# Patient Record
Sex: Male | Born: 1960 | Race: White | Hispanic: No | Marital: Single | Smoking: Current every day smoker
Health system: Southern US, Community
[De-identification: ages and names within clinical notes are randomized; demographics above are authoritative.]

## PROBLEM LIST (undated history)

## (undated) DIAGNOSIS — F319 Bipolar disorder, unspecified: Secondary | ICD-10-CM

## (undated) DIAGNOSIS — J45909 Unspecified asthma, uncomplicated: Secondary | ICD-10-CM

## (undated) DIAGNOSIS — E785 Hyperlipidemia, unspecified: Secondary | ICD-10-CM

## (undated) DIAGNOSIS — I509 Heart failure, unspecified: Secondary | ICD-10-CM

## (undated) DIAGNOSIS — M1990 Unspecified osteoarthritis, unspecified site: Secondary | ICD-10-CM

## (undated) HISTORY — DX: Heart failure, unspecified: I50.9

## (undated) HISTORY — DX: Unspecified osteoarthritis, unspecified site: M19.90

## (undated) HISTORY — DX: Bipolar disorder, unspecified: F31.9

## (undated) HISTORY — PX: CHOLECYSTECTOMY: SHX55

## (undated) HISTORY — DX: Unspecified asthma, uncomplicated: J45.909

## (undated) HISTORY — DX: Hyperlipidemia, unspecified: E78.5

---

## 2013-01-25 DIAGNOSIS — G40909 Epilepsy, unspecified, not intractable, without status epilepticus: Secondary | ICD-10-CM

## 2013-01-25 HISTORY — DX: Epilepsy, unspecified, not intractable, without status epilepticus: G40.909

## 2013-01-30 DIAGNOSIS — F411 Generalized anxiety disorder: Secondary | ICD-10-CM

## 2013-01-30 HISTORY — DX: Generalized anxiety disorder: F41.1

## 2013-10-11 DIAGNOSIS — E119 Type 2 diabetes mellitus without complications: Secondary | ICD-10-CM

## 2013-10-11 HISTORY — DX: Type 2 diabetes mellitus without complications: E11.9

## 2014-10-04 DIAGNOSIS — I509 Heart failure, unspecified: Secondary | ICD-10-CM

## 2014-10-04 DIAGNOSIS — I2510 Atherosclerotic heart disease of native coronary artery without angina pectoris: Secondary | ICD-10-CM | POA: Insufficient documentation

## 2014-10-04 HISTORY — DX: Heart failure, unspecified: I50.9

## 2014-10-04 HISTORY — DX: Atherosclerotic heart disease of native coronary artery without angina pectoris: I25.10

## 2017-05-12 DIAGNOSIS — G894 Chronic pain syndrome: Secondary | ICD-10-CM

## 2017-05-12 DIAGNOSIS — E1142 Type 2 diabetes mellitus with diabetic polyneuropathy: Secondary | ICD-10-CM

## 2017-05-12 DIAGNOSIS — Z720 Tobacco use: Secondary | ICD-10-CM

## 2017-05-12 DIAGNOSIS — M7918 Myalgia, other site: Secondary | ICD-10-CM

## 2017-05-12 DIAGNOSIS — M19019 Primary osteoarthritis, unspecified shoulder: Secondary | ICD-10-CM

## 2017-05-12 HISTORY — DX: Primary osteoarthritis, unspecified shoulder: M19.019

## 2017-05-12 HISTORY — DX: Tobacco use: Z72.0

## 2017-05-12 HISTORY — DX: Myalgia, other site: M79.18

## 2017-05-12 HISTORY — DX: Chronic pain syndrome: G89.4

## 2017-05-12 HISTORY — DX: Type 2 diabetes mellitus with diabetic polyneuropathy: E11.42

## 2017-07-09 DIAGNOSIS — I10 Essential (primary) hypertension: Secondary | ICD-10-CM

## 2017-07-09 DIAGNOSIS — J449 Chronic obstructive pulmonary disease, unspecified: Secondary | ICD-10-CM

## 2017-07-09 HISTORY — DX: Chronic obstructive pulmonary disease, unspecified: J44.9

## 2017-07-09 HISTORY — DX: Essential (primary) hypertension: I10

## 2019-01-01 DIAGNOSIS — E6609 Other obesity due to excess calories: Secondary | ICD-10-CM

## 2019-01-01 DIAGNOSIS — Z6831 Body mass index (BMI) 31.0-31.9, adult: Secondary | ICD-10-CM

## 2019-01-01 HISTORY — DX: Other obesity due to excess calories: E66.09

## 2019-01-01 HISTORY — DX: Body mass index (BMI) 31.0-31.9, adult: Z68.31

## 2019-06-30 ENCOUNTER — Ambulatory Visit: Payer: Medicaid Other | Admitting: Cardiology

## 2019-07-02 ENCOUNTER — Encounter: Payer: Self-pay | Admitting: *Deleted

## 2019-07-02 ENCOUNTER — Ambulatory Visit: Payer: Medicaid Other | Admitting: Cardiology

## 2019-08-26 ENCOUNTER — Other Ambulatory Visit: Payer: Self-pay | Admitting: Gastroenterology

## 2019-08-28 ENCOUNTER — Other Ambulatory Visit (HOSPITAL_COMMUNITY)
Admission: RE | Admit: 2019-08-28 | Discharge: 2019-08-28 | Disposition: A | Discharge status: Home / Self Care | Payer: Medicaid Other | Source: Ambulatory Visit | Attending: Gastroenterology | Admitting: Gastroenterology

## 2019-08-28 DIAGNOSIS — Z20822 Contact with and (suspected) exposure to covid-19: Secondary | ICD-10-CM | POA: Diagnosis not present

## 2019-08-28 DIAGNOSIS — Z01812 Encounter for preprocedural laboratory examination: Secondary | ICD-10-CM | POA: Diagnosis present

## 2019-08-30 ENCOUNTER — Encounter (HOSPITAL_COMMUNITY): Payer: Self-pay | Admitting: Gastroenterology

## 2019-08-30 ENCOUNTER — Other Ambulatory Visit: Payer: Self-pay

## 2019-08-30 LAB — NOVEL CORONAVIRUS, NAA (HOSP ORDER, SEND-OUT TO REF LAB; TAT 18-24 HRS): SARS-CoV-2, NAA: NOT DETECTED

## 2019-09-01 ENCOUNTER — Ambulatory Visit (HOSPITAL_COMMUNITY)
Admission: RE | Admit: 2019-09-01 | Discharge: 2019-09-01 | Disposition: A | Discharge status: Home / Self Care | Payer: Medicaid Other | Attending: Gastroenterology | Admitting: Gastroenterology

## 2019-09-01 ENCOUNTER — Encounter (HOSPITAL_COMMUNITY)
Admission: RE | Disposition: A | Discharge status: Home / Self Care | Payer: Self-pay | Source: Home / Self Care | Attending: Gastroenterology

## 2019-09-01 ENCOUNTER — Ambulatory Visit (HOSPITAL_COMMUNITY): Payer: Medicaid Other | Admitting: Certified Registered"

## 2019-09-01 ENCOUNTER — Encounter (HOSPITAL_COMMUNITY): Payer: Self-pay | Admitting: Gastroenterology

## 2019-09-01 ENCOUNTER — Other Ambulatory Visit: Payer: Self-pay

## 2019-09-01 DIAGNOSIS — Z885 Allergy status to narcotic agent status: Secondary | ICD-10-CM | POA: Diagnosis not present

## 2019-09-01 DIAGNOSIS — M1990 Unspecified osteoarthritis, unspecified site: Secondary | ICD-10-CM | POA: Insufficient documentation

## 2019-09-01 DIAGNOSIS — F17200 Nicotine dependence, unspecified, uncomplicated: Secondary | ICD-10-CM | POA: Diagnosis not present

## 2019-09-01 DIAGNOSIS — Z8249 Family history of ischemic heart disease and other diseases of the circulatory system: Secondary | ICD-10-CM | POA: Diagnosis not present

## 2019-09-01 DIAGNOSIS — J449 Chronic obstructive pulmonary disease, unspecified: Secondary | ICD-10-CM | POA: Insufficient documentation

## 2019-09-01 DIAGNOSIS — Z6835 Body mass index (BMI) 35.0-35.9, adult: Secondary | ICD-10-CM | POA: Insufficient documentation

## 2019-09-01 DIAGNOSIS — I2510 Atherosclerotic heart disease of native coronary artery without angina pectoris: Secondary | ICD-10-CM | POA: Diagnosis not present

## 2019-09-01 DIAGNOSIS — E1142 Type 2 diabetes mellitus with diabetic polyneuropathy: Secondary | ICD-10-CM | POA: Diagnosis not present

## 2019-09-01 DIAGNOSIS — G894 Chronic pain syndrome: Secondary | ICD-10-CM | POA: Diagnosis not present

## 2019-09-01 DIAGNOSIS — F319 Bipolar disorder, unspecified: Secondary | ICD-10-CM | POA: Insufficient documentation

## 2019-09-01 DIAGNOSIS — I110 Hypertensive heart disease with heart failure: Secondary | ICD-10-CM | POA: Insufficient documentation

## 2019-09-01 DIAGNOSIS — Z79899 Other long term (current) drug therapy: Secondary | ICD-10-CM | POA: Insufficient documentation

## 2019-09-01 DIAGNOSIS — G40909 Epilepsy, unspecified, not intractable, without status epilepticus: Secondary | ICD-10-CM | POA: Diagnosis not present

## 2019-09-01 DIAGNOSIS — C252 Malignant neoplasm of tail of pancreas: Secondary | ICD-10-CM | POA: Diagnosis not present

## 2019-09-01 DIAGNOSIS — Z794 Long term (current) use of insulin: Secondary | ICD-10-CM | POA: Insufficient documentation

## 2019-09-01 DIAGNOSIS — Z833 Family history of diabetes mellitus: Secondary | ICD-10-CM | POA: Insufficient documentation

## 2019-09-01 DIAGNOSIS — E669 Obesity, unspecified: Secondary | ICD-10-CM | POA: Diagnosis not present

## 2019-09-01 DIAGNOSIS — K869 Disease of pancreas, unspecified: Secondary | ICD-10-CM | POA: Diagnosis present

## 2019-09-01 DIAGNOSIS — Z888 Allergy status to other drugs, medicaments and biological substances status: Secondary | ICD-10-CM | POA: Diagnosis not present

## 2019-09-01 DIAGNOSIS — I509 Heart failure, unspecified: Secondary | ICD-10-CM | POA: Insufficient documentation

## 2019-09-01 HISTORY — PX: ESOPHAGOGASTRODUODENOSCOPY (EGD) WITH PROPOFOL: SHX5813

## 2019-09-01 HISTORY — PX: UPPER ESOPHAGEAL ENDOSCOPIC ULTRASOUND (EUS): SHX6562

## 2019-09-01 HISTORY — PX: FINE NEEDLE ASPIRATION: SHX5430

## 2019-09-01 LAB — POCT I-STAT, CHEM 8
BUN: 15 mg/dL (ref 6–20)
Calcium, Ion: 1.07 mmol/L — ABNORMAL LOW (ref 1.15–1.40)
Chloride: 101 mmol/L (ref 98–111)
Creatinine, Ser: 0.7 mg/dL (ref 0.61–1.24)
Glucose, Bld: 279 mg/dL — ABNORMAL HIGH (ref 70–99)
HCT: 46 % (ref 39.0–52.0)
Hemoglobin: 15.6 g/dL (ref 13.0–17.0)
Potassium: 4.8 mmol/L (ref 3.5–5.1)
Sodium: 135 mmol/L (ref 135–145)
TCO2: 26 mmol/L (ref 22–32)

## 2019-09-01 LAB — GLUCOSE, CAPILLARY
Glucose-Capillary: 266 mg/dL — ABNORMAL HIGH (ref 70–99)
Glucose-Capillary: 235 mg/dL — ABNORMAL HIGH (ref 70–99)

## 2019-09-01 SURGERY — UPPER ESOPHAGEAL ENDOSCOPIC ULTRASOUND (EUS)
Anesthesia: Monitor Anesthesia Care

## 2019-09-01 MED ORDER — INSULIN REGULAR HUMAN 100 UNIT/ML IJ SOLN
INTRAMUSCULAR | Status: DC | PRN
  Administered 2019-09-01: 11:00:00 5 [IU] via SUBCUTANEOUS

## 2019-09-01 MED ORDER — PROPOFOL 10 MG/ML IV BOLUS
INTRAVENOUS | Status: DC | PRN
  Administered 2019-09-01: 150 ug/kg/min via INTRAVENOUS

## 2019-09-01 MED ORDER — LACTATED RINGERS IV SOLN: INTRAVENOUS | Status: DC | PRN

## 2019-09-01 MED ORDER — HYDROMORPHONE HCL 4 MG PO TABS: 4.0000 mg | ORAL_TABLET | ORAL | 0 refills | Status: AC | PRN

## 2019-09-01 MED ORDER — INSULIN ASPART 100 UNIT/ML ~~LOC~~ SOLN
SUBCUTANEOUS | Status: AC
  Filled 2019-09-01: qty 1

## 2019-09-01 NOTE — Discharge Instructions (Signed)
Endoscopic Ultrasound ° °Care After °Please read the instructions outlined below and refer to this sheet in the next few weeks. These discharge instructions provide you with general information on caring for yourself after you leave the hospital. Your doctor may also give you specific instructions. While your treatment has been planned according to the most current medical practices available, unavoidable complications occasionally occur. If you have any problems or questions after discharge, please call Dr. Fran Neiswonger ([facility]) at [phone]. ° °HOME CARE INSTRUCTIONS °Activity °· You may resume your regular activity but move at a slower pace for the next 24 hours.  °· Take frequent rest periods for the next 24 hours.  °· Walking will help expel (get rid of) the air and reduce the bloated feeling in your abdomen.  °· No driving for 24 hours (because of the anesthesia (medicine) used during the test).  °· You may shower.  °· Do not sign any important legal documents or operate any machinery for 24 hours (because of the anesthesia used during the test).  °Nutrition °· Drink plenty of fluids.  °· You may resume your normal diet.  °· Begin with a light meal and progress to your normal diet.  °· Avoid alcoholic beverages for 24 hours or as instructed by your caregiver.  °Medications °You may resume your normal medications unless your caregiver tells you otherwise. °What you can expect today °· You may experience abdominal discomfort such as a feeling of fullness or "gas" pains.  °· You may experience a sore throat for 2 to 3 days. This is normal. Gargling with salt water may help this.  °·  °SEEK IMMEDIATE MEDICAL CARE IF: °· You have excessive nausea (feeling sick to your stomach) and/or vomiting.  °· You have severe abdominal pain and distention (swelling).  °· You have trouble swallowing.  °· You have a temperature over 100° F (37.8° C).  °· You have rectal bleeding or vomiting of blood.  °Document  Released: 03/26/2004 Document Revised: 04/24/2011 Document Reviewed: 10/07/2007 °ExitCare® Patient Information ©2012 [employer]. °

## 2019-09-01 NOTE — Anesthesia Postprocedure Evaluation (Signed)
Anesthesia Post Note  Patient: Daniel Pittman  Procedure(s) Performed: UPPER ESOPHAGEAL ENDOSCOPIC ULTRASOUND (EUS) with FNA (N/A ) ESOPHAGOGASTRODUODENOSCOPY (EGD) WITH PROPOFOL (N/A )     Patient location during evaluation: Endoscopy Anesthesia Type: MAC Level of consciousness: awake and alert Pain management: pain level controlled Vital Signs Assessment: post-procedure vital signs reviewed and stable Respiratory status: spontaneous breathing, nonlabored ventilation, respiratory function stable and patient connected to nasal cannula oxygen Cardiovascular status: stable and blood pressure returned to baseline Postop Assessment: no apparent nausea or vomiting Anesthetic complications: no    Last Vitals:  Vitals:   09/01/19 1240 09/01/19 1250  BP: (!) 133/101 (!) 145/89  Pulse: 85 80  Resp: 18 18  Temp:    SpO2: 98% 97%    Last Pain:  Vitals:   09/01/19 1250  TempSrc:   PainSc: 10-Worst pain ever                 Ryzen Deady P Zemirah Krasinski

## 2019-09-01 NOTE — H&P (Signed)
[facility] Admission Note  Chief Complaint: pancreatic mass  HPI: Daniel Pittman is an 59 y.o. male.  With 3 month history upper abdominal pain, radiating to back, worse over the past 3 months.  Chronic pain in low back and hips, distinctly different than current symptoms.  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  Past Medical History:  Diagnosis Date  . Arthritis   . 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 Asthma   . CHF (congestive heart failure) ([place])   . Chronic coronary artery disease 10/04/2014  . Chronic pain syndrome 05/12/2017  . Class 1 obesity due to excess calories with serious comorbidity and body mass index (BMI) of 31.0 to 31.9 in adult 01/01/2019  . COPD (chronic obstructive pulmonary disease) ([place]) 07/09/2017  . Diabetes mellitus type 2, uncomplicated ([place]) 8/50/2774  . Diabetic polyneuropathy associated with type 2 diabetes mellitus ([place]) 05/12/2017  . Generalized anxiety disorder 01/30/2013  . Heart failure, unspecified ([place]) 10/04/2014  . Hyperlipemia   . Hypertension 07/09/2017  . Manic-depression ([place])   . Myofascial pain syndrome 05/12/2017  . Seizure disorder ([place]) 01/25/2013  . Tobacco abuse 05/12/2017    Past Surgical History:  Procedure Laterality Date  . CHOLECYSTECTOMY      Medications Prior to Admission  Medication Sig Dispense Refill  . carvedilol (COREG) 6.25 MG tablet Take 6.25 mg by mouth 2 (two) times daily.    . furosemide (LASIX) 20 MG tablet Take 20 mg by mouth 2 (two) times daily.    Marland Kitchen glipiZIDE (GLUCOTROL) 5 MG tablet Take 5 mg by mouth 2 (two) times daily.    Marland Kitchen HUMALOG KWIKPEN 100 UNIT/ML KwikPen Inject 3-11 Units into the skin 3 (three) times daily with meals.    Marland Kitchen LANTUS SOLOSTAR 100 UNIT/ML Solostar Pen Inject 25 Units into the skin at bedtime.    Marland Kitchen lisinopril-hydrochlorothiazide (ZESTORETIC) 20-25 MG tablet Take 1 tablet by mouth daily.    Marland Kitchen omeprazole (PRILOSEC) 40 MG capsule Take 40 mg by  mouth daily as needed.    Marland Kitchen oxyCODONE-acetaminophen (PERCOCET/ROXICET) 5-325 MG tablet Take 1 tablet by mouth every 6 (six) hours as needed for pain.    . Vitamin D, Ergocalciferol, (DRISDOL) 1.25 MG (50000 UT) CAPS capsule Take 50,000 Units by mouth every Wednesday.    Marland Kitchen NARCAN 4 MG/0.1ML LIQD nasal spray kit Place 1 spray into the nose as needed. Use every 2-3 minutes until responsive or EMS arrives      Allergies:  Allergies  Allergen Reactions  . Hydrocodone-Acetaminophen Itching  . Tramadol Nausea And Vomiting    Family History  Problem Relation Age of Onset  . Cancer Mother   . Diabetes Mother   . Hypertension Mother   . Cancer Father   . Stroke Sister   . Seizures Sister   . Hypertension Sister   . Diabetes Maternal Grandmother     Social History:  reports that he has been smoking. He has never used smokeless tobacco. He reports previous alcohol use. No history on file for drug.   ROS: As per HPI, all others negative   Blood pressure (!) 174/112, pulse 83, temperature 98.9 F (37.2 C), temperature source Oral, resp. rate 19, height '5\' 7"'  (1.702 m), weight 102.1 kg, SpO2 98 %. General appearance: overweight, NAD Head: NCAT Eyes: Anicteric Neck: Supple GI: Upper abdominal tenderness without peritonitis Extremities: No edema Skin: No rash Neurologic: Non focal without lateralizing signs  Results for orders placed or performed  during the hospital encounter of 09/01/19 (from the past 48 hour(s))  Glucose, capillary     Status: Abnormal   Collection Time: 09/01/19 10:42 AM  Result Value Ref Range   Glucose-Capillary 266 (H) 70 - 99 mg/dL  I-STAT, chem 8     Status: Abnormal   Collection Time: 09/01/19 10:55 AM  Result Value Ref Range   Sodium 135 135 - 145 mmol/L   Potassium 4.8 3.5 - 5.1 mmol/L   Chloride 101 98 - 111 mmol/L   BUN 15 6 - 20 mg/dL   Creatinine, Ser 0.70 0.61 - 1.24 mg/dL   Glucose, Bld 279 (H) 70 - 99 mg/dL   Calcium, Ion 1.07 (L) 1.15 - 1.40  mmol/L   TCO2 26 22 - 32 mmol/L   Hemoglobin 15.6 13.0 - 17.0 g/dL   HCT 46.0 39.0 - 52.0 %   No results found.  Assessment/Plan  1.  Abdominal pain, likely from pancreatic tail mass. 2.  Plan for endoscopic ultrasound with fine needle aspiration. 3.  Next step in management pending EUS findings. 4.  Risks (bleeding, infection, bowel perforation that could require surgery, sedation-related changes in cardiopulmonary systems), benefits (identification and possible treatment of source of symptoms, exclusion of certain causes of symptoms), and alternatives (watchful waiting, radiographic imaging studies, empiric medical treatment) of upper endoscopy with ultrasound and likely fine needle aspiration (EUS +/- FNA) were explained to patient/family in detail and patient wishes to proceed.  [name] 09/01/2019, 11:26 AM

## 2019-09-01 NOTE — Anesthesia Preprocedure Evaluation (Addendum)
Anesthesia Evaluation  Patient identified by MRN, date of birth, ID band Patient awake    Reviewed: Allergy & Precautions, NPO status , Patient's Chart, lab work & pertinent test results, reviewed documented beta blocker date and time   Airway Mallampati: II  TM Distance: >3 FB Neck ROM: Full    Dental  (+) Edentulous Upper, Edentulous Lower   Pulmonary asthma , COPD,  COPD inhaler, Current SmokerPatient did not abstain from smoking.,    Pulmonary exam normal breath sounds clear to auscultation       Cardiovascular hypertension, Pt. on home beta blockers and Pt. on medications + CAD and +CHF  Normal cardiovascular exam Rhythm:Regular Rate:Normal     Neuro/Psych PSYCHIATRIC DISORDERS Anxiety Bipolar Disorder    GI/Hepatic negative GI ROS, (+)     substance abuse  ,   Endo/Other  diabetes, Oral Hypoglycemic Agents, Insulin Dependent  Renal/GU negative Renal ROS     Musculoskeletal  (+) narcotic dependentMyofascial pain syndrome Chronic pain syndrome   Abdominal (+) + obese,   Peds  Hematology HLD   Anesthesia Other Findings pancreatic mass  Reproductive/Obstetrics                            Anesthesia Physical Anesthesia Plan  ASA: III  Anesthesia Plan: MAC   Post-op Pain Management:    Induction: Intravenous  PONV Risk Score and Plan: 0 and Propofol infusion and Treatment may vary due to age or medical condition  Airway Management Planned: Nasal Cannula  Additional Equipment:   Intra-op Plan:   Post-operative Plan:   Informed Consent: I have reviewed the patients History and Physical, chart, labs and discussed the procedure including the risks, benefits and alternatives for the proposed anesthesia with the patient or authorized representative who has indicated his/her understanding and acceptance.       Plan Discussed with: CRNA  Anesthesia Plan Comments:         Anesthesia Quick Evaluation

## 2019-09-01 NOTE — Op Note (Signed)
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 Estimated Blood Loss:     Estimated blood loss: none. Procedure:                Pre-Anesthesia Assessment:                           - Prior to the procedure, a History and Physical                            was performed, and patient medications and                            allergies were reviewed. The patient's tolerance of                            previous anesthesia was also reviewed. The risks                            and benefits of the procedure and the sedation                            options and risks were discussed with the patient.                            All questions were answered, and informed consent                            was obtained. Prior Anticoagulants: The patient has                            taken no previous anticoagulant or antiplatelet                            agents. ASA Grade Assessment: III - A patient with                            severe systemic disease. After reviewing the risks  and benefits, the patient was deemed in                            satisfactory condition to undergo the procedure.                           After obtaining informed consent, the endoscope was                            passed under direct vision. Throughout the                            procedure, the patient's blood pressure, pulse, and     oxygen saturations were monitored continuously. The                            [id number]) Olympus Linear EUS was                            introduced through the mouth, and advanced to the                            second part of duodenum. The upper EUS was                            accomplished without difficulty. The patient                            tolerated the procedure well. Scope In: Scope Out: Findings:      ENDOSONOGRAPHIC FINDING: :      There was no sign of significant endosonographic abnormality in the left       lobe of the liver.      One lymph node was visualized with the ultrasound probe in the celiac       region (level 20). The node was round. It measured 5 mm by 5 mm.      An irregular mass was identified in the pancreatic tail. The mass was       hypoechoic. The mass measured 25 mm by 30 mm in maximal cross-sectional       diameter. The endosonographic borders were poorly-defined. There was       sonographic evidence suggesting invasion into the splenic artery       (manifested by invasion) and the splenic vein (manifested by invasion).       An intact interface was seen between the mass and the superior       mesenteric artery and celiac trunk suggesting a lack of invasion. The       remainder of the pancreas was examined. The endosonographic appearance       of parenchyma and the upstream pancreatic duct indicated duct dilation.       Fine needle aspiration for cytology was performed. Color Doppler imaging       was utilized prior to needle puncture to confirm a lack of significant       vascular structures within the needle path. Three passes were made with       the 25 gauge needle using a transgastric  approach. A stylet was used. A       cytologist was present and performed a preliminary cytologic       examination. Final cytology results are pending. Impression:               - There was no evidence of significant pathology in                             the left lobe of the liver.                           - One lymph node was visualized and measured in the                            celiac region (level 20).                           - A mass was identified in the pancreatic tail.                            This was staged T3 N1 Mx by endosonographic                            criteria. Fine needle aspiration performed. Moderate Sedation:      None Recommendation:           - Discharge patient to home (via wheelchair).                           - Resume previous diet today.                           - Continue present medications.                           - Await cytology results.                           - Return to GI office PRN.                           - Refer to a surgeon at appointment to be scheduled.                           - Refer to an oncologist at appointment to be                            scheduled. Procedure Code(s):        --- Professional ---                           [phone], Esophagogastroduodenoscopy, flexible,                            transoral; with transendoscopic ultrasound-guided  intramural or transmural fine needle                            aspiration/biopsy(s), (includes endoscopic                            ultrasound examination limited to the esophagus,                            stomach or duodenum, and adjacent structures) Diagnosis Code(s):        --- Professional ---                           K86.89, Other specified diseases of pancreas CPT copyright 2019 American Medical Association. All rights reserved. The codes documented in this report are preliminary and upon coder review may  be revised to meet current compliance requirements. Arta Silence, MD 09/01/2019 12:26:20 PM This report has been signed electronically. Number of Addenda: 0

## 2019-09-01 NOTE — Anesthesia Procedure Notes (Signed)
Procedure Name: MAC Date/Time: 09/01/2019 11:55 AM Performed by: Cynda Familia, CRNA Pre-anesthesia Checklist: Patient identified, Suction available, Patient being monitored and Emergency Drugs available Patient Re-evaluated:Patient Re-evaluated prior to induction Oxygen Delivery Method: Simple face mask Placement Confirmation: positive ETCO2 and breath sounds checked- equal and bilateral Dental Injury: Teeth and Oropharynx as per pre-operative assessment

## 2019-09-01 NOTE — Transfer of Care (Signed)
Immediate Anesthesia Transfer of Care Note  Patient: Daniel Pittman  Procedure(s) Performed: UPPER ESOPHAGEAL ENDOSCOPIC ULTRASOUND (EUS) with FNA (N/A ) ESOPHAGOGASTRODUODENOSCOPY (EGD) WITH PROPOFOL (N/A )  Patient Location: PACU and Endoscopy Unit  Anesthesia Type:MAC  Level of Consciousness: awake and alert   Airway & Oxygen Therapy: Patient Spontanous Breathing and Patient connected to face mask oxygen  Post-op Assessment: Report given to RN and Post -op Vital signs reviewed and stable  Post vital signs: Reviewed and stable  Last Vitals:  Vitals Value Taken Time  BP 131/89 09/01/19 1230  Temp 36.3 C 09/01/19 1227  Pulse 84 09/01/19 1236  Resp 15 09/01/19 1236  SpO2 97 % 09/01/19 1236  Vitals shown include unvalidated device data.  Last Pain:  Vitals:   09/01/19 1230  TempSrc:   PainSc: 10-Worst pain ever         Complications: No apparent anesthesia complications

## 2019-09-02 LAB — CYTOLOGY - NON PAP

## 2019-09-02 MED FILL — Insulin Regular (Human) Inj 100 Unit/ML: INTRAMUSCULAR | Qty: 0.05 | Status: AC

## 2019-09-03 ENCOUNTER — Encounter: Payer: Self-pay | Admitting: *Deleted

## 2019-09-10 ENCOUNTER — Other Ambulatory Visit: Payer: Self-pay | Admitting: Nurse Practitioner

## 2019-09-10 ENCOUNTER — Other Ambulatory Visit: Payer: Self-pay | Admitting: Hematology

## 2019-09-10 ENCOUNTER — Encounter: Payer: Self-pay | Admitting: *Deleted

## 2019-09-10 DIAGNOSIS — C259 Malignant neoplasm of pancreas, unspecified: Secondary | ICD-10-CM

## 2019-09-10 NOTE — Progress Notes (Signed)
Reached out to Garvin Fila to introduce myself as the office RN Navigator and explain our new patient process. Reviewed the reason for their referral and scheduled their new patient appointment along with labs. Provided address and directions to the office including call back phone number. Reviewed with patient any concerns they may have or any possible barriers to attending their appointment.   Informed patient about my role as a navigator and that I will meet with them prior to their New Patient appointment and more fully discuss what services I can provide. At this time patient has no further questions or needs.   Per medical records, patient has had CTs and MRI which were not included in the referral. Patient states both scans were completed in [place] but is not certain on location. He requests that I call.  Called the office and Orlinda Blalock. They faxed radiology reports. Scanned into chart, and a copy given to Dr Maylon Peppers.

## 2019-09-10 NOTE — Progress Notes (Signed)
[facility] CONSULT NOTE  Patient Care Team: Earlyne Iba, NP as PCP - General (Nurse Practitioner)  HEME/ONC OVERVIEW: 1. 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  Patient underwent EUS on 09/01/2019, which showed mass in the pancreatic tail involving the splenic vein/artery, but the celiac trunk and SMA were intact.  A biopsy of the pancreatic mass showed adenocarcinoma.  The patient was referred to oncology for further evaluation. -I discussed the imaging and pathology results in detail with the patient, as well as the NCCN guideline -In light of the locally advanced disease, I would recommend PET scan to rule out occult metastases -I requested MSI testing on the pancreatic biopsy, but unfortunately there was insufficient tissue for testing  -In addition, given the patient's young age at the diagnosis, I have referred the patient to genetics to rule out germline mutation -If PET scan does not show any evidence of metastatic disease, his options include  standard-of-care neoadjuvant chemotherapy (3-6 months) and clinical trials -I discussed the case with Dr. Dorene Grebe at [facility], who informed me that there are clinical trials studying the duration of neoadjuvant FOLFIRINOX in locally advanced pancreatic cancer.  However, given the patient's borderline performance status, extensive medical comorbidities, including COPD, poorly controlled DM II, and heavy tobacco use, his candidacy for FOLFIRINOX is questionable.  Gemcitabine/Abraxane may be a better tolerated neoadjuvant regimen.  -Patient has to [place] [facility], but lives one mile away from [facility]  Furthermore, he relies on his family for transportation, and is concerned about his ability to drive back and forth for treatment.  Therefore, I have referred the patient to [facility] for further evaluation and management.  Cancer-related pain -Currently on Percocet 7.5/325, q4hrs PRN for pain -Due to the patient not having his pain medication with him, and having to drive over 1 hour home, I have ordered Percocet 5/325 x 2 tabs to be given in clinic today -Continue follow-up with oncology at [facility] for pain management  Tobacco abuse -Patient currently smokes 2 packs/day for the past 40 years -I spent some time counseling the patient on the importance of smoking cessation, especially as it increases the risk of adverse outcome from chemotherapy and potential complications from surgery -We discussed some of the methods for smoking cessation aid, including nicotine gums, patches, and medications -Patient expressed understanding, and would like to work on cutting back on tobacco usage with a goal of stopping smoking in the near future  Orders Placed This Encounter  Procedures  . NM PET Image Initial (PI) Skull Base To Thigh  Standing Status:   Future    Standing Expiration Date:   09/12/2020    Order Specific Question:   If  indicated for the ordered procedure, I authorize the administration of a radiopharmaceutical per Radiology protocol    Answer:   Yes    Order Specific Question:   Preferred imaging location?    Answer:   [facility]    Order Specific Question:   Radiology Contrast Protocol - do NOT remove file path    Answer:   [email or web address]  . Ambulatory referral to Hematology / Oncology    Referral Priority:   Urgent    Referral Type:   Consultation    Referral Reason:   Patient Preference    Requested Specialty:   Oncology    Number of Visits Requested:   1   All questions were answered. The patient knows to call the clinic with any problems, questions or concerns. No barriers to learning was detected.  Return as needed.  The patient has been referred to [facility] for further evaluation and management.  Tish Men, MD 1/18/202111:33 AM  CHIEF COMPLAINTS/PURPOSE OF CONSULTATION:  "I am hurting badly"  HISTORY OF PRESENTING ILLNESS:  Daniel Pittman 59 y.o. male is here because of newly diagnosed locally advanced pancreatic cancer.  Patient reports that starting about 4 months ago, he started to feel hungry all the time, despite eating regularly.  About a month ago, he developed new onset left upper quadrant pain, radiating to the back, severe intensity, for which he takes Percocet 7.5/325 at least 4 times a day with reasonable pain control.  He denies any fever, night sweats, weight loss, nausea, vomiting, diarrhea.  He smokes at least 2 packs a day for the past 40 years.  He drinks occasionally and denies illicit drug use.  He is disabled.  He reports that his paternal aunt had kidney cancer, father with lung cancer, mother with skin cancer.  There is no family history of colon or gynecologic cancer.  REVIEW OF SYSTEMS:   Constitutional: ( - ) fevers, ( - )  chills , ( - ) night sweats Eyes: ( - ) blurriness of vision, ( - ) double vision, ( - )  watery eyes Ears, nose, mouth, throat, and face: ( - ) mucositis, ( - ) sore throat Respiratory: ( - ) cough, ( - ) dyspnea, ( - ) wheezes Cardiovascular: ( - ) palpitation, ( - ) chest discomfort, ( - ) lower extremity swelling Gastrointestinal:  ( - ) nausea, ( - ) heartburn, ( - ) change in bowel habits Skin: ( - ) abnormal skin rashes Lymphatics: ( - ) new lymphadenopathy, ( - ) easy bruising Neurological: ( - ) numbness, ( - ) tingling, ( - ) new weaknesses Behavioral/Psych: ( - ) mood change, ( - ) new changes  All other systems were reviewed with the patient and are negative.  I have reviewed his chart and materials related to his cancer extensively and collaborated history with the patient. Summary of oncologic history is as follows: Oncology History  Pancreatic adenocarcinoma ([place])  08/16/2019 Imaging   CT abdomen/pelvis: 1. Diffuse thickening of the pancreatic tail, which is heterogeneous in appearance.  MRI correlation is recommended. 2. Fatty liver 3. Evidence of prior cholecystectomy   08/25/2019 Imaging   MRI abdomen: 1. Poorly marginated hypoenhancing 4.9 x 3.2 cm pancreatic tail mass, highly suspicious for primary pancreatic adenocarcinoma. 2. 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  The splenic artery encased proximally by the pancreatic mass.  No appreciable involvement of the celiac trunk, SMA, or portal vein.  3. No abdominal lymphadenopathy or liver masses 4. Mild diffuse hepatic steatosis 5. Right adrenal adenoma 6. Aortic atherosclerosis   09/01/2019 Procedure   EUS: - There was no evidence of significant pathology in the left lobe of the liver. - One lymph node was visualized and measured in the celiac region (level 20). - A mass was identified in the pancreatic tail. This was staged T3 N1 Mx by endosonographic criteria. Fine needle aspiration performed.   09/01/2019 Pathology Results   FINAL MICROSCOPIC DIAGNOSIS:  - Malignant cells consistent with  adenocarcinoma    09/10/2019 Initial Diagnosis   Pancreatic adenocarcinoma [facility])     MEDICAL HISTORY:  Past Medical History:  Diagnosis Date  . Arthritis   . 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 Asthma   . CHF (congestive heart failure) ([place])   . Chronic coronary artery disease 10/04/2014  . Chronic pain syndrome 05/12/2017  . Class 1 obesity due to excess calories with serious comorbidity and body mass index (BMI) of 31.0 to 31.9 in adult 01/01/2019  . COPD (chronic obstructive pulmonary disease) ([place]) 07/09/2017  . Diabetes mellitus type 2, uncomplicated ([place]) 02/26/5008  . Diabetic polyneuropathy associated with type 2 diabetes mellitus ([place]) 05/12/2017  . Generalized anxiety disorder 01/30/2013  . Heart failure, unspecified ([place]) 10/04/2014  . Hyperlipemia   . Hypertension 07/09/2017  . Manic-depression ([place])   . Myofascial pain syndrome 05/12/2017  . Seizure disorder ([place]) 01/25/2013  . Tobacco abuse 05/12/2017    SURGICAL HISTORY: Past Surgical History:  Procedure Laterality Date  . CHOLECYSTECTOMY    . ESOPHAGOGASTRODUODENOSCOPY (EGD) WITH PROPOFOL N/A 09/01/2019   Procedure: ESOPHAGOGASTRODUODENOSCOPY (EGD) WITH PROPOFOL;  Surgeon: Arta Silence, MD;  Location: [facility] ENDOSCOPY;  Service: Endoscopy;  Laterality: N/A;  . FINE NEEDLE ASPIRATION N/A 09/01/2019   Procedure: FINE NEEDLE ASPIRATION (FNA) LINEAR;  Surgeon: Arta Silence, MD;  Location: [facility] ENDOSCOPY;  Service: Endoscopy;  Laterality: N/A;  . UPPER ESOPHAGEAL ENDOSCOPIC ULTRASOUND (EUS) N/A 09/01/2019   Procedure: UPPER ESOPHAGEAL ENDOSCOPIC ULTRASOUND (EUS) with FNA;  Surgeon: Arta Silence, MD;  Location: [facility];  Service: Endoscopy;  Laterality: N/A;    SOCIAL HISTORY: Social History   Socioeconomic History  . Marital status: Single    Spouse name: Not on file  . Number of children: Not on file  . Years of education: Not on file  . Highest education level: Not on file  Occupational History  . Not on  file  Tobacco Use  . Smoking status: Current Every Day Smoker    Packs/day: 3.00    Types: Cigarettes  . Smokeless tobacco: Never Used  Substance and Sexual Activity  . Alcohol use: Not Currently  . Drug use: Not on file  . Sexual activity: Not on file  Other Topics Concern  . Not on file  Social History Narrative  . Not on file   Social Determinants of Health   Financial Resource Strain:   . Difficulty of Paying Living Expenses: Not on file  Food Insecurity:   . Worried About Charity fundraiser in the Last Year: Not on file  . Ran Out of Food in the Last Year: Not on file  Transportation Needs:   . Lack of Transportation (Medical): Not on file  . Lack of Transportation (Non-Medical): Not on file  Physical Activity:   . Days of Exercise per Week: Not  on file  . Minutes of Exercise per Session: Not on file  Stress:   . Feeling of Stress : Not on file  Social Connections:   . Frequency of Communication with Friends and Family: Not on file  . Frequency of Social Gatherings with Friends and Family: Not on file  . Attends Religious Services: Not on file  . Active Member of Clubs or Organizations: Not on file  . Attends Archivist Meetings: Not on file  . Marital Status: Not on file  Intimate Partner Violence:   . Fear of Current or Ex-Partner: Not on file  . Emotionally Abused: Not on file  . Physically Abused: Not on file  . Sexually Abused: Not on file    FAMILY HISTORY: Family History  Problem Relation Age of Onset  . Cancer Mother   . Diabetes Mother   . Hypertension Mother   . Cancer Father   . Stroke Sister   . Seizures Sister   . Hypertension Sister   . Diabetes Maternal Grandmother     ALLERGIES:  is allergic to hydrocodone-acetaminophen and tramadol.  MEDICATIONS:  Current Outpatient Medications  Medication Sig Dispense Refill  . carvedilol (COREG) 6.25 MG tablet Take 6.25 mg by mouth 2 (two) times daily.    . furosemide (LASIX) 20 MG  tablet Take 20 mg by mouth 2 (two) times daily.    Marland Kitchen glipiZIDE (GLUCOTROL) 5 MG tablet Take 5 mg by mouth 2 (two) times daily.    Marland Kitchen HUMALOG KWIKPEN 100 UNIT/ML KwikPen Inject 3-11 Units into the skin 3 (three) times daily with meals.    Marland Kitchen LANTUS SOLOSTAR 100 UNIT/ML Solostar Pen Inject 25 Units into the skin at bedtime.    Marland Kitchen lisinopril-hydrochlorothiazide (ZESTORETIC) 20-25 MG tablet Take 1 tablet by mouth daily.    Marland Kitchen omeprazole (PRILOSEC) 40 MG capsule Take 40 mg by mouth daily as needed.    . Vitamin D, Ergocalciferol, (DRISDOL) 1.25 MG (50000 UT) CAPS capsule Take 50,000 Units by mouth every Wednesday.    Marland Kitchen NARCAN 4 MG/0.1ML LIQD nasal spray kit Place 1 spray into the nose as needed. Use every 2-3 minutes until responsive or EMS arrives     No current facility-administered medications for this visit.    PHYSICAL EXAMINATION: ECOG PERFORMANCE STATUS: 1-2  Vitals:   09/13/19 1040  BP: 138/90  Pulse: 81  Resp: 18  Temp: (!) 97.1 F (36.2 C)  SpO2: 98%   Filed Weights   09/13/19 1040  Weight: 228 lb (103.4 kg)     GENERAL: alert, uncomfortable, periodically getting up and walking around in the exam room due to abdominal/back pain, older than stated age  SKIN: skin color, texture, turgor are normal, no rashes or significant lesions EYES: conjunctiva are pink and non-injected, sclera clear OROPHARYNX: no exudate, no erythema; lips, buccal mucosa, and tongue normal  NECK: supple, non-tender LYMPH:  no palpable lymphadenopathy in the cervical LUNGS: faint bilateral end-expiratory wheezing, no coarse rhonchi or rales  HEART: regular rate & rhythm, no murmurs, no lower extremity edema ABDOMEN: soft, non-tender, non-distended, normal bowel sounds Musculoskeletal: no cyanosis of digits and no clubbing  PSYCH: alert & oriented x 3, fluent speech  LABORATORY DATA:  I have reviewed the data as listed Lab Results  Component Value Date   WBC 10.1 09/13/2019   HGB 15.7 09/13/2019    HCT 45.4 09/13/2019   MCV 90.8 09/13/2019   PLT 229 09/13/2019   Lab Results  Component Value Date  NA 133 (L) 09/13/2019   K 4.2 09/13/2019   CL 95 (L) 09/13/2019   CO2 31 09/13/2019    RADIOGRAPHIC STUDIES: I have personally reviewed the radiological images as listed and agreed with the findings in the report. No results found.  PATHOLOGY: I have reviewed the pathology reports as documented in the oncologist history.

## 2019-09-13 ENCOUNTER — Inpatient Hospital Stay (HOSPITAL_BASED_OUTPATIENT_CLINIC_OR_DEPARTMENT_OTHER): Payer: Medicaid Other | Admitting: Hematology

## 2019-09-13 ENCOUNTER — Inpatient Hospital Stay: Payer: Medicaid Other | Attending: Hematology

## 2019-09-13 ENCOUNTER — Encounter: Payer: Self-pay | Admitting: *Deleted

## 2019-09-13 ENCOUNTER — Encounter: Payer: Self-pay | Admitting: Hematology

## 2019-09-13 ENCOUNTER — Other Ambulatory Visit: Payer: Self-pay

## 2019-09-13 VITALS — BP 138/90 | HR 81 | Temp 97.1°F | Resp 18 | Ht 67.0 in | Wt 228.0 lb

## 2019-09-13 DIAGNOSIS — Z8249 Family history of ischemic heart disease and other diseases of the circulatory system: Secondary | ICD-10-CM | POA: Diagnosis not present

## 2019-09-13 DIAGNOSIS — K219 Gastro-esophageal reflux disease without esophagitis: Secondary | ICD-10-CM | POA: Diagnosis not present

## 2019-09-13 DIAGNOSIS — Z794 Long term (current) use of insulin: Secondary | ICD-10-CM | POA: Insufficient documentation

## 2019-09-13 DIAGNOSIS — Z833 Family history of diabetes mellitus: Secondary | ICD-10-CM | POA: Insufficient documentation

## 2019-09-13 DIAGNOSIS — C252 Malignant neoplasm of tail of pancreas: Secondary | ICD-10-CM | POA: Diagnosis present

## 2019-09-13 DIAGNOSIS — Z8051 Family history of malignant neoplasm of kidney: Secondary | ICD-10-CM | POA: Insufficient documentation

## 2019-09-13 DIAGNOSIS — J449 Chronic obstructive pulmonary disease, unspecified: Secondary | ICD-10-CM | POA: Insufficient documentation

## 2019-09-13 DIAGNOSIS — C259 Malignant neoplasm of pancreas, unspecified: Secondary | ICD-10-CM

## 2019-09-13 DIAGNOSIS — Z79899 Other long term (current) drug therapy: Secondary | ICD-10-CM | POA: Diagnosis not present

## 2019-09-13 DIAGNOSIS — I509 Heart failure, unspecified: Secondary | ICD-10-CM | POA: Diagnosis not present

## 2019-09-13 DIAGNOSIS — I110 Hypertensive heart disease with heart failure: Secondary | ICD-10-CM

## 2019-09-13 DIAGNOSIS — G893 Neoplasm related pain (acute) (chronic): Secondary | ICD-10-CM | POA: Insufficient documentation

## 2019-09-13 DIAGNOSIS — F17210 Nicotine dependence, cigarettes, uncomplicated: Secondary | ICD-10-CM

## 2019-09-13 DIAGNOSIS — E785 Hyperlipidemia, unspecified: Secondary | ICD-10-CM

## 2019-09-13 DIAGNOSIS — E1165 Type 2 diabetes mellitus with hyperglycemia: Secondary | ICD-10-CM | POA: Diagnosis not present

## 2019-09-13 DIAGNOSIS — Z720 Tobacco use: Secondary | ICD-10-CM

## 2019-09-13 LAB — CBC WITH DIFFERENTIAL (CANCER CENTER ONLY)
Abs Immature Granulocytes: 0.03 10*3/uL (ref 0.00–0.07)
Basophils Absolute: 0.1 10*3/uL (ref 0.0–0.1)
Basophils Relative: 1 %
Eosinophils Absolute: 0.1 10*3/uL (ref 0.0–0.5)
Eosinophils Relative: 1 %
HCT: 45.4 % (ref 39.0–52.0)
Hemoglobin: 15.7 g/dL (ref 13.0–17.0)
Immature Granulocytes: 0 %
Lymphocytes Relative: 16 %
Lymphs Abs: 1.7 10*3/uL (ref 0.7–4.0)
MCH: 31.4 pg (ref 26.0–34.0)
MCHC: 34.6 g/dL (ref 30.0–36.0)
MCV: 90.8 fL (ref 80.0–100.0)
Monocytes Absolute: 0.7 10*3/uL (ref 0.1–1.0)
Monocytes Relative: 7 %
Neutro Abs: 7.5 10*3/uL (ref 1.7–7.7)
Neutrophils Relative %: 75 %
Platelet Count: 229 10*3/uL (ref 150–400)
RBC: 5 MIL/uL (ref 4.22–5.81)
RDW: 12.7 % (ref 11.5–15.5)
WBC Count: 10.1 10*3/uL (ref 4.0–10.5)
nRBC: 0 % (ref 0.0–0.2)

## 2019-09-13 LAB — CMP (CANCER CENTER ONLY)
ALT: 10 U/L (ref 0–44)
AST: 9 U/L — ABNORMAL LOW (ref 15–41)
Albumin: 3.8 g/dL (ref 3.5–5.0)
Alkaline Phosphatase: 62 U/L (ref 38–126)
Anion gap: 7 (ref 5–15)
BUN: 17 mg/dL (ref 6–20)
CO2: 31 mmol/L (ref 22–32)
Calcium: 9.2 mg/dL (ref 8.9–10.3)
Chloride: 95 mmol/L — ABNORMAL LOW (ref 98–111)
Creatinine: 1.09 mg/dL (ref 0.61–1.24)
GFR, Est AFR Am: >60 mL/min (ref >60)
GFR, Estimated: >60 mL/min (ref >60)
Glucose, Bld: 380 mg/dL — ABNORMAL HIGH (ref 70–99)
Potassium: 4.2 mmol/L (ref 3.5–5.1)
Sodium: 133 mmol/L — ABNORMAL LOW (ref 135–145)
Total Bilirubin: 0.8 mg/dL (ref 0.3–1.2)
Total Protein: 6.1 g/dL — ABNORMAL LOW (ref 6.5–8.1)

## 2019-09-13 MED ORDER — OXYCODONE-ACETAMINOPHEN 5-325 MG PO TABS
2.0000 | ORAL_TABLET | Freq: Once | ORAL | Status: AC
  Administered 2019-09-13: 11:00:00 2 via ORAL

## 2019-09-13 MED ORDER — OXYCODONE-ACETAMINOPHEN 5-325 MG PO TABS
ORAL_TABLET | ORAL | Status: AC
  Filled 2019-09-13: qty 2

## 2019-09-13 NOTE — Progress Notes (Signed)
Initial RN Navigator Patient Visit  Name: Daniel Pittman Date of Referral : 09/09/19 Diagnosis: Pancreatic  Met with patient prior to their visit with MD. Hanley Seamen patient "Your Patient Navigator" handout which explains my role, areas in which I am able to help, and all the contact information for myself and the office. Also gave patient MD and Navigator business card. Reviewed with patient the general overview of expected course after initial diagnosis and time frame for all steps to be completed.  New patient packet given to patient which includes: orientation to office and staff; campus directory; education on My Chart and Advance Directives; and patient centered education on pancreatic cancer.  Patient completed visit with Dr. Maylon Peppers  Patient lives about one mile from [facility]. They had questions about transferring care to that location. Patient doesn't drive and the family member who is driving also lives in [place]. We will work to have patient's care transferred to [facility], Dr Hinton Rao.  PET Scan - called [facility] to have PET scheduled there. Their first available is 09/27/19. Due to being so far out, PET was scheduled at [facility] on 09/20/19. Patient and family member who transports in agreement with this plan. Patient/family given all pre PET scan info including NPO after MN, low carb 12h prior and for him to hold all diabetic medication the morning of the scan.   Genetics Referral - Order for referral placed. We drew his [facility] blood work with his pre appointment blood work and will FEDEx today. Roma Kayser in genetics notified.   MSI Testing - Dr Maylon Peppers requesting MSI testing on patient's pathology. Called and spoke to [place] who placed the order. Received a call back from [place] shortly afterward stating the cell block was reviewed for testing potential and there is insufficient tissue. No MSI testing can be completed. Dr Maylon Peppers notified.   Patient  understands all follow up procedures and expectations. They have my number to reach out for any further clarification or additional needs.

## 2019-09-14 ENCOUNTER — Telehealth: Payer: Self-pay | Admitting: Hematology

## 2019-09-14 ENCOUNTER — Encounter: Payer: Self-pay | Admitting: Genetic Counselor

## 2019-09-14 LAB — CANCER ANTIGEN 19-9: CA 19-9: 4481 U/mL — ABNORMAL HIGH (ref 0–35)

## 2019-09-14 NOTE — Telephone Encounter (Signed)
Scheduled appt per 1/18 sch message - unable to reach pt and unable to leave message. Mailed letter and made genetic counselor aware

## 2019-09-20 ENCOUNTER — Other Ambulatory Visit: Payer: Self-pay

## 2019-09-20 ENCOUNTER — Ambulatory Visit (HOSPITAL_COMMUNITY)
Admission: RE | Admit: 2019-09-20 | Discharge: 2019-09-20 | Disposition: A | Discharge status: Home / Self Care | Payer: Medicaid Other | Source: Ambulatory Visit | Attending: Hematology | Admitting: Hematology

## 2019-09-20 DIAGNOSIS — C259 Malignant neoplasm of pancreas, unspecified: Secondary | ICD-10-CM | POA: Insufficient documentation

## 2019-09-20 LAB — GLUCOSE, CAPILLARY: Glucose-Capillary: 329 mg/dL — ABNORMAL HIGH (ref 70–99)

## 2019-09-20 MED ORDER — FLUDEOXYGLUCOSE F - 18 (FDG) INJECTION
11.3900 | Freq: Once | INTRAVENOUS | Status: AC | PRN
  Administered 2019-09-20: 11.39 via INTRAVENOUS

## 2019-09-21 ENCOUNTER — Encounter: Payer: Self-pay | Admitting: *Deleted

## 2019-09-21 NOTE — Progress Notes (Signed)
Called patient with the following results  "Can you let the patient know that his PET didn't show any metastatic disease? We can send a copy of the results to his oncologist in [place].  Thanks.  [place]"  Confirmed that patient had a new patient appointment with Dr Hinton Rao this Friday. Copy of PET Scan report routed to her. Patient was thankful for the information.

## 2019-09-22 ENCOUNTER — Other Ambulatory Visit: Payer: Self-pay | Admitting: Hematology

## 2019-09-22 ENCOUNTER — Encounter: Payer: Self-pay | Admitting: *Deleted

## 2019-09-22 DIAGNOSIS — C259 Malignant neoplasm of pancreas, unspecified: Secondary | ICD-10-CM

## 2019-09-22 MED ORDER — MORPHINE SULFATE ER 15 MG PO TBCR: 15.0000 mg | EXTENDED_RELEASE_TABLET | Freq: Two times a day (BID) | ORAL | 0 refills | Status: AC

## 2019-09-22 NOTE — Progress Notes (Signed)
Patient is calling stating he has back pain 10/10. He has percocet at home, but it's only slightly decreasing his pain, and only for a short duration. He would like to have something that could help his pain for a longer duration.  Spoke with Dr Maylon Peppers. He will send a 14 day supply of MSContin for pain management. Once patient sees Dr Hinton Rao this Friday, she will need to begin pain management per her protocol.  Notified patient of new prescription and pharmacy. Reviewed with him that going forward he will need to work with Dr Hinton Rao for pain management. He understood.

## 2019-09-24 ENCOUNTER — Telehealth: Payer: Self-pay | Admitting: Genetic Counselor

## 2019-09-24 DIAGNOSIS — C252 Malignant neoplasm of tail of pancreas: Secondary | ICD-10-CM

## 2019-09-24 NOTE — Telephone Encounter (Signed)
Called patient regarding upcoming virtual visit, patient has been notified.

## 2019-09-28 ENCOUNTER — Telehealth: Payer: Medicaid Other | Admitting: Genetic Counselor

## 2019-09-28 ENCOUNTER — Telehealth: Payer: Self-pay | Admitting: Genetic Counselor

## 2019-09-28 NOTE — Telephone Encounter (Signed)
LVM regarding his virtual genetic counseling appointment scheduled for today (2/2) at 1pm. Requested that he call back if he has trouble joining the visit or if he needs to reschedule.

## 2019-10-29 DIAGNOSIS — C252 Malignant neoplasm of tail of pancreas: Secondary | ICD-10-CM

## 2019-12-16 DIAGNOSIS — J90 Pleural effusion, not elsewhere classified: Secondary | ICD-10-CM | POA: Diagnosis not present

## 2019-12-16 DIAGNOSIS — E8809 Other disorders of plasma-protein metabolism, not elsewhere classified: Secondary | ICD-10-CM | POA: Diagnosis not present

## 2019-12-16 DIAGNOSIS — R739 Hyperglycemia, unspecified: Secondary | ICD-10-CM | POA: Diagnosis not present

## 2019-12-16 DIAGNOSIS — E871 Hypo-osmolality and hyponatremia: Secondary | ICD-10-CM | POA: Diagnosis not present

## 2019-12-17 DIAGNOSIS — R739 Hyperglycemia, unspecified: Secondary | ICD-10-CM | POA: Diagnosis not present

## 2019-12-17 DIAGNOSIS — E8809 Other disorders of plasma-protein metabolism, not elsewhere classified: Secondary | ICD-10-CM | POA: Diagnosis not present

## 2019-12-17 DIAGNOSIS — I351 Nonrheumatic aortic (valve) insufficiency: Secondary | ICD-10-CM | POA: Diagnosis not present

## 2019-12-17 DIAGNOSIS — J90 Pleural effusion, not elsewhere classified: Secondary | ICD-10-CM | POA: Diagnosis not present

## 2019-12-17 DIAGNOSIS — I361 Nonrheumatic tricuspid (valve) insufficiency: Secondary | ICD-10-CM | POA: Diagnosis not present

## 2019-12-17 DIAGNOSIS — E871 Hypo-osmolality and hyponatremia: Secondary | ICD-10-CM | POA: Diagnosis not present

## 2019-12-17 DIAGNOSIS — J9601 Acute respiratory failure with hypoxia: Secondary | ICD-10-CM | POA: Diagnosis not present

## 2019-12-18 DIAGNOSIS — E8809 Other disorders of plasma-protein metabolism, not elsewhere classified: Secondary | ICD-10-CM | POA: Diagnosis not present

## 2019-12-18 DIAGNOSIS — J90 Pleural effusion, not elsewhere classified: Secondary | ICD-10-CM | POA: Diagnosis not present

## 2019-12-18 DIAGNOSIS — C252 Malignant neoplasm of tail of pancreas: Secondary | ICD-10-CM | POA: Diagnosis not present

## 2019-12-18 DIAGNOSIS — I480 Paroxysmal atrial fibrillation: Secondary | ICD-10-CM | POA: Diagnosis not present

## 2019-12-18 DIAGNOSIS — E871 Hypo-osmolality and hyponatremia: Secondary | ICD-10-CM | POA: Diagnosis not present

## 2019-12-18 DIAGNOSIS — Z87891 Personal history of nicotine dependence: Secondary | ICD-10-CM

## 2019-12-18 DIAGNOSIS — R739 Hyperglycemia, unspecified: Secondary | ICD-10-CM | POA: Diagnosis not present

## 2019-12-19 DIAGNOSIS — E871 Hypo-osmolality and hyponatremia: Secondary | ICD-10-CM | POA: Diagnosis not present

## 2019-12-19 DIAGNOSIS — E8809 Other disorders of plasma-protein metabolism, not elsewhere classified: Secondary | ICD-10-CM | POA: Diagnosis not present

## 2019-12-19 DIAGNOSIS — R739 Hyperglycemia, unspecified: Secondary | ICD-10-CM | POA: Diagnosis not present

## 2019-12-19 DIAGNOSIS — J90 Pleural effusion, not elsewhere classified: Secondary | ICD-10-CM | POA: Diagnosis not present

## 2019-12-20 DIAGNOSIS — E871 Hypo-osmolality and hyponatremia: Secondary | ICD-10-CM | POA: Diagnosis not present

## 2019-12-20 DIAGNOSIS — J90 Pleural effusion, not elsewhere classified: Secondary | ICD-10-CM | POA: Diagnosis not present

## 2019-12-20 DIAGNOSIS — E8809 Other disorders of plasma-protein metabolism, not elsewhere classified: Secondary | ICD-10-CM | POA: Diagnosis not present

## 2019-12-20 DIAGNOSIS — R739 Hyperglycemia, unspecified: Secondary | ICD-10-CM | POA: Diagnosis not present

## 2019-12-30 DIAGNOSIS — C252 Malignant neoplasm of tail of pancreas: Secondary | ICD-10-CM

## 2020-01-27 DIAGNOSIS — C252 Malignant neoplasm of tail of pancreas: Secondary | ICD-10-CM | POA: Diagnosis not present

## 2020-02-24 DIAGNOSIS — C252 Malignant neoplasm of tail of pancreas: Secondary | ICD-10-CM

## 2020-03-06 DIAGNOSIS — C252 Malignant neoplasm of tail of pancreas: Secondary | ICD-10-CM

## 2020-03-07 DIAGNOSIS — C252 Malignant neoplasm of tail of pancreas: Secondary | ICD-10-CM

## 2020-03-16 DIAGNOSIS — C252 Malignant neoplasm of tail of pancreas: Secondary | ICD-10-CM

## 2020-03-30 DIAGNOSIS — C252 Malignant neoplasm of tail of pancreas: Secondary | ICD-10-CM

## 2020-04-02 DIAGNOSIS — N183 Chronic kidney disease, stage 3 unspecified: Secondary | ICD-10-CM

## 2020-04-02 DIAGNOSIS — I5033 Acute on chronic diastolic (congestive) heart failure: Secondary | ICD-10-CM

## 2020-04-02 DIAGNOSIS — I4892 Unspecified atrial flutter: Secondary | ICD-10-CM

## 2020-04-02 DIAGNOSIS — Z8507 Personal history of malignant neoplasm of pancreas: Secondary | ICD-10-CM

## 2020-04-03 DIAGNOSIS — I361 Nonrheumatic tricuspid (valve) insufficiency: Secondary | ICD-10-CM

## 2020-04-03 DIAGNOSIS — I4892 Unspecified atrial flutter: Secondary | ICD-10-CM | POA: Diagnosis not present

## 2020-04-03 DIAGNOSIS — Z8507 Personal history of malignant neoplasm of pancreas: Secondary | ICD-10-CM | POA: Diagnosis not present

## 2020-04-03 DIAGNOSIS — I5033 Acute on chronic diastolic (congestive) heart failure: Secondary | ICD-10-CM | POA: Diagnosis not present

## 2020-04-03 DIAGNOSIS — N183 Chronic kidney disease, stage 3 unspecified: Secondary | ICD-10-CM | POA: Diagnosis not present

## 2020-04-04 DIAGNOSIS — Z8507 Personal history of malignant neoplasm of pancreas: Secondary | ICD-10-CM | POA: Diagnosis not present

## 2020-04-04 DIAGNOSIS — N183 Chronic kidney disease, stage 3 unspecified: Secondary | ICD-10-CM | POA: Diagnosis not present

## 2020-04-04 DIAGNOSIS — I361 Nonrheumatic tricuspid (valve) insufficiency: Secondary | ICD-10-CM | POA: Diagnosis not present

## 2020-04-04 DIAGNOSIS — I4892 Unspecified atrial flutter: Secondary | ICD-10-CM | POA: Diagnosis not present

## 2020-04-04 DIAGNOSIS — I5033 Acute on chronic diastolic (congestive) heart failure: Secondary | ICD-10-CM | POA: Diagnosis not present

## 2020-04-05 DIAGNOSIS — I4892 Unspecified atrial flutter: Secondary | ICD-10-CM | POA: Diagnosis not present

## 2020-04-05 DIAGNOSIS — Z8507 Personal history of malignant neoplasm of pancreas: Secondary | ICD-10-CM | POA: Diagnosis not present

## 2020-04-05 DIAGNOSIS — I5033 Acute on chronic diastolic (congestive) heart failure: Secondary | ICD-10-CM | POA: Diagnosis not present

## 2020-04-05 DIAGNOSIS — N183 Chronic kidney disease, stage 3 unspecified: Secondary | ICD-10-CM | POA: Diagnosis not present

## 2020-04-06 DIAGNOSIS — I5033 Acute on chronic diastolic (congestive) heart failure: Secondary | ICD-10-CM | POA: Diagnosis not present

## 2020-04-06 DIAGNOSIS — Z8507 Personal history of malignant neoplasm of pancreas: Secondary | ICD-10-CM | POA: Diagnosis not present

## 2020-04-06 DIAGNOSIS — N183 Chronic kidney disease, stage 3 unspecified: Secondary | ICD-10-CM | POA: Diagnosis not present

## 2020-04-06 DIAGNOSIS — I4892 Unspecified atrial flutter: Secondary | ICD-10-CM | POA: Diagnosis not present

## 2020-04-07 ENCOUNTER — Ambulatory Visit: Payer: Medicaid Other | Admitting: Cardiology

## 2020-04-07 DIAGNOSIS — I5033 Acute on chronic diastolic (congestive) heart failure: Secondary | ICD-10-CM | POA: Diagnosis not present

## 2020-04-07 DIAGNOSIS — N183 Chronic kidney disease, stage 3 unspecified: Secondary | ICD-10-CM | POA: Diagnosis not present

## 2020-04-07 DIAGNOSIS — Z8507 Personal history of malignant neoplasm of pancreas: Secondary | ICD-10-CM | POA: Diagnosis not present

## 2020-04-07 DIAGNOSIS — C252 Malignant neoplasm of tail of pancreas: Secondary | ICD-10-CM | POA: Diagnosis not present

## 2020-04-07 DIAGNOSIS — I4892 Unspecified atrial flutter: Secondary | ICD-10-CM | POA: Diagnosis not present

## 2020-04-08 DIAGNOSIS — I5033 Acute on chronic diastolic (congestive) heart failure: Secondary | ICD-10-CM

## 2020-04-08 DIAGNOSIS — G894 Chronic pain syndrome: Secondary | ICD-10-CM

## 2020-04-08 DIAGNOSIS — I4819 Other persistent atrial fibrillation: Secondary | ICD-10-CM | POA: Diagnosis not present

## 2020-04-08 DIAGNOSIS — N183 Chronic kidney disease, stage 3 unspecified: Secondary | ICD-10-CM | POA: Diagnosis not present

## 2020-04-09 DIAGNOSIS — I5033 Acute on chronic diastolic (congestive) heart failure: Secondary | ICD-10-CM | POA: Diagnosis not present

## 2020-04-09 DIAGNOSIS — I4819 Other persistent atrial fibrillation: Secondary | ICD-10-CM | POA: Diagnosis not present

## 2020-04-09 DIAGNOSIS — G894 Chronic pain syndrome: Secondary | ICD-10-CM | POA: Diagnosis not present

## 2020-04-09 DIAGNOSIS — N183 Chronic kidney disease, stage 3 unspecified: Secondary | ICD-10-CM | POA: Diagnosis not present

## 2020-04-10 DIAGNOSIS — I4819 Other persistent atrial fibrillation: Secondary | ICD-10-CM | POA: Diagnosis not present

## 2020-04-10 DIAGNOSIS — N183 Chronic kidney disease, stage 3 unspecified: Secondary | ICD-10-CM | POA: Diagnosis not present

## 2020-04-10 DIAGNOSIS — C252 Malignant neoplasm of tail of pancreas: Secondary | ICD-10-CM | POA: Diagnosis not present

## 2020-04-10 DIAGNOSIS — I5033 Acute on chronic diastolic (congestive) heart failure: Secondary | ICD-10-CM | POA: Diagnosis not present

## 2020-04-11 DIAGNOSIS — I4819 Other persistent atrial fibrillation: Secondary | ICD-10-CM | POA: Diagnosis not present

## 2020-04-11 DIAGNOSIS — G894 Chronic pain syndrome: Secondary | ICD-10-CM | POA: Diagnosis not present

## 2020-04-11 DIAGNOSIS — N183 Chronic kidney disease, stage 3 unspecified: Secondary | ICD-10-CM | POA: Diagnosis not present

## 2020-04-11 DIAGNOSIS — J189 Pneumonia, unspecified organism: Secondary | ICD-10-CM

## 2020-04-11 DIAGNOSIS — I5033 Acute on chronic diastolic (congestive) heart failure: Secondary | ICD-10-CM | POA: Diagnosis not present

## 2020-04-12 DIAGNOSIS — N183 Chronic kidney disease, stage 3 unspecified: Secondary | ICD-10-CM | POA: Diagnosis not present

## 2020-04-12 DIAGNOSIS — I5033 Acute on chronic diastolic (congestive) heart failure: Secondary | ICD-10-CM | POA: Diagnosis not present

## 2020-04-12 DIAGNOSIS — J189 Pneumonia, unspecified organism: Secondary | ICD-10-CM | POA: Diagnosis not present

## 2020-04-12 DIAGNOSIS — I4819 Other persistent atrial fibrillation: Secondary | ICD-10-CM | POA: Diagnosis not present

## 2020-04-13 DIAGNOSIS — J189 Pneumonia, unspecified organism: Secondary | ICD-10-CM | POA: Diagnosis not present

## 2020-04-13 DIAGNOSIS — I4819 Other persistent atrial fibrillation: Secondary | ICD-10-CM | POA: Diagnosis not present

## 2020-04-13 DIAGNOSIS — I5033 Acute on chronic diastolic (congestive) heart failure: Secondary | ICD-10-CM | POA: Diagnosis not present

## 2020-04-13 DIAGNOSIS — C252 Malignant neoplasm of tail of pancreas: Secondary | ICD-10-CM | POA: Diagnosis not present

## 2020-04-13 DIAGNOSIS — N183 Chronic kidney disease, stage 3 unspecified: Secondary | ICD-10-CM | POA: Diagnosis not present

## 2020-04-14 DIAGNOSIS — J189 Pneumonia, unspecified organism: Secondary | ICD-10-CM | POA: Diagnosis not present

## 2020-04-14 DIAGNOSIS — I5033 Acute on chronic diastolic (congestive) heart failure: Secondary | ICD-10-CM | POA: Diagnosis not present

## 2020-04-14 DIAGNOSIS — I4819 Other persistent atrial fibrillation: Secondary | ICD-10-CM | POA: Diagnosis not present

## 2020-04-14 DIAGNOSIS — N183 Chronic kidney disease, stage 3 unspecified: Secondary | ICD-10-CM | POA: Diagnosis not present

## 2020-04-14 DIAGNOSIS — C252 Malignant neoplasm of tail of pancreas: Secondary | ICD-10-CM

## 2020-04-15 DIAGNOSIS — I4819 Other persistent atrial fibrillation: Secondary | ICD-10-CM | POA: Diagnosis not present

## 2020-04-15 DIAGNOSIS — I5030 Unspecified diastolic (congestive) heart failure: Secondary | ICD-10-CM | POA: Diagnosis not present

## 2020-04-26 ENCOUNTER — Telehealth: Payer: Self-pay | Admitting: Cardiology

## 2020-04-26 NOTE — Telephone Encounter (Signed)
Pt c/o swelling: STAT is pt has developed SOB within 24 hours  1) How much weight have you gained and in what time span? 18 lbs in a week   2) If swelling, where is the swelling located? Lower extremities and belly   3) Are you currently taking a fluid pill? Yes   4) Are you currently SOB? Yes, wheezing and bilateral load     5) Do you have a log of your daily weights (if so, list)?  04/18/20 237        04/26/20 255 lbs  6) Have you gained 3 pounds in a day or 5 pounds in a week? Yes   7) Have you traveled recently? Yes   Daniel Pittman, [name] nurse is calling with an update on Daniel Pittman's swelling. She states his medication was changed in the hospital from Lasix to Torsemide 20 MG's twice daily. She states if she is unable to answer when calling back a detailed message can be left. Please advise.

## 2020-04-27 ENCOUNTER — Ambulatory Visit (INDEPENDENT_AMBULATORY_CARE_PROVIDER_SITE_OTHER): Payer: Medicaid Other | Admitting: Cardiology

## 2020-04-27 ENCOUNTER — Other Ambulatory Visit: Payer: Self-pay

## 2020-04-27 ENCOUNTER — Encounter: Payer: Self-pay | Admitting: Cardiology

## 2020-04-27 VITALS — BP 118/80 | HR 80 | Ht 67.0 in | Wt 264.0 lb

## 2020-04-27 DIAGNOSIS — I5033 Acute on chronic diastolic (congestive) heart failure: Secondary | ICD-10-CM

## 2020-04-27 DIAGNOSIS — I2510 Atherosclerotic heart disease of native coronary artery without angina pectoris: Secondary | ICD-10-CM | POA: Diagnosis not present

## 2020-04-27 DIAGNOSIS — C259 Malignant neoplasm of pancreas, unspecified: Secondary | ICD-10-CM | POA: Diagnosis not present

## 2020-04-27 DIAGNOSIS — I10 Essential (primary) hypertension: Secondary | ICD-10-CM

## 2020-04-27 DIAGNOSIS — Z720 Tobacco use: Secondary | ICD-10-CM | POA: Diagnosis not present

## 2020-04-27 DIAGNOSIS — I4819 Other persistent atrial fibrillation: Secondary | ICD-10-CM

## 2020-04-27 DIAGNOSIS — I4891 Unspecified atrial fibrillation: Secondary | ICD-10-CM | POA: Insufficient documentation

## 2020-04-27 DIAGNOSIS — E1142 Type 2 diabetes mellitus with diabetic polyneuropathy: Secondary | ICD-10-CM

## 2020-04-27 NOTE — Progress Notes (Signed)
Cardiology Office Note:    Date:  04/27/2020   ID:  Daniel Pittman, DOB 07-07-1961, MRN [id number]  PCP:  Earlyne Iba, NP  Cardiologist:  Jenean Lindau, MD   Referring MD: Earlyne Iba, NP    ASSESSMENT:    1. Essential hypertension   2. Chronic coronary artery disease   3. Tobacco abuse   4. Pancreatic adenocarcinoma (HCC)   5. Persistent atrial fibrillation ([place])   6. Diabetic polyneuropathy associated with type 2 diabetes mellitus ([place])    PLAN:    In order of problems listed above:  1. Recent records from [place] on admission was reviewed extensively. 2. Congestive heart failure: Patient has bilateral wheezing and mild crepitations at the bases significant ascites and pedal edema.  I reviewed records and cardiology evaluation by my partner and from the cardiology from the other practice suggesting hospice.  I agree with this.  Patient has significant congestive heart failure and will need intravenous therapy.  I called Dr. Keenan Bachelor at the hospital to see if he would be willing to admit him directly as the patient does not want to go and wait in the emergency room.  He is working in discussing this issue to see if this bed availability. 3. Atrial fibrillation: Rates are well controlled.  Not on anticoagulation because of multiple comorbidities.  This was also well documented by my partner in hospital admission.  Patient does not want anticoagulation and knows the risks. 4. Cigarette smoker: I spent 5 minutes with the patient discussing solely about smoking. Smoking cessation was counseled. I suggested to the patient also different medications and pharmacological interventions. Patient is keen to try stopping on its own at this time. He will get back to me if he needs any further assistance in this matter. 5. I discussed the above issues with the patient at length and questions were answered to satisfaction.  Total time for this evaluation was 30  minutes.   Medication Adjustments/Labs and Tests Ordered: Current medicines are reviewed at length with the patient today.  Concerns regarding medicines are outlined above.  No orders of the defined types were placed in this encounter.  No orders of the defined types were placed in this encounter.    Chief Complaint  Patient presents with  . Edema  . Shortness of Breath     History of Present Illness:    Daniel Pittman is a 59 y.o. male.  Patient has past medical history of pancreatic adenocarcinoma and has received therapy for it.  He has history of atrial fibrillation congestive heart failure diabetes mellitus and unfortunately continues to smoke.  He came in here because of some swelling.  He mentions to me that he is having shortness of breath.  No chest pain orthopnea or PND.  Sitting on the chair he appears comfortable.  He mentions to me that he will not be able to walk 5 steps to get into his house and he needs treatment for this.  He does not want to go to the emergency room and wants to me if he can be directly admitted.  Past Medical History:  Diagnosis Date  . Arthritis   . 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 Asthma   . CHF (congestive heart failure) ([place])   . Chronic coronary artery disease 10/04/2014  . Chronic pain syndrome 05/12/2017  . Class 1 obesity due to excess calories with serious comorbidity and body mass index (BMI) of 31.0 to 31.9  in adult 01/01/2019  . COPD (chronic obstructive pulmonary disease) ([place]) 07/09/2017  . Diabetes mellitus type 2, uncomplicated ([place]) 08/29/1279  . Diabetic polyneuropathy associated with type 2 diabetes mellitus ([place]) 05/12/2017  . Generalized anxiety disorder 01/30/2013  . Heart failure, unspecified ([place]) 10/04/2014  . Hyperlipemia   . Hypertension 07/09/2017  . Manic-depression ([place])   . Myofascial pain syndrome 05/12/2017  . Seizure disorder ([place]) 01/25/2013  . Tobacco abuse 05/12/2017    Past Surgical History:   Procedure Laterality Date  . CHOLECYSTECTOMY    . ESOPHAGOGASTRODUODENOSCOPY (EGD) WITH PROPOFOL N/A 09/01/2019   Procedure: ESOPHAGOGASTRODUODENOSCOPY (EGD) WITH PROPOFOL;  Surgeon: Arta Silence, MD;  Location: [facility] ENDOSCOPY;  Service: Endoscopy;  Laterality: N/A;  . FINE NEEDLE ASPIRATION N/A 09/01/2019   Procedure: FINE NEEDLE ASPIRATION (FNA) LINEAR;  Surgeon: Arta Silence, MD;  Location: [facility] ENDOSCOPY;  Service: Endoscopy;  Laterality: N/A;  . UPPER ESOPHAGEAL ENDOSCOPIC ULTRASOUND (EUS) N/A 09/01/2019   Procedure: UPPER ESOPHAGEAL ENDOSCOPIC ULTRASOUND (EUS) with FNA;  Surgeon: Arta Silence, MD;  Location: [facility];  Service: Endoscopy;  Laterality: N/A;    Current Medications: Current Meds  Medication Sig  . ADVAIR DISKUS 250-50 MCG/DOSE AEPB Inhale 1 puff into the lungs 2 (two) times daily.  . carvedilol (COREG) 6.25 MG tablet Take 6.25 mg by mouth 2 (two) times daily.  . digoxin (LANOXIN) 0.125 MG tablet Take 125 mcg by mouth daily.  . furosemide (LASIX) 20 MG tablet Take 20 mg by mouth daily.   Marland Kitchen gabapentin (NEURONTIN) 300 MG capsule Take 300 mg by mouth 2 (two) times daily.  Marland Kitchen glipiZIDE (GLUCOTROL) 5 MG tablet Take 5 mg by mouth 2 (two) times daily.  Marland Kitchen HUMALOG KWIKPEN 100 UNIT/ML KwikPen Inject 3-11 Units into the skin 3 (three) times daily with meals.  Marland Kitchen LANTUS SOLOSTAR 100 UNIT/ML Solostar Pen Inject 25 Units into the skin at bedtime.  Marland Kitchen lisinopril (ZESTRIL) 5 MG tablet Take 5 mg by mouth daily.  Marland Kitchen morphine (MS CONTIN) 30 MG 12 hr tablet SMARTSIG:1 Tablet(s) By Mouth Every 12 Hours  . NARCAN 4 MG/0.1ML LIQD nasal spray kit Place 1 spray into the nose as needed. Use every 2-3 minutes until responsive or EMS arrives  . omeprazole (PRILOSEC) 40 MG capsule Take 40 mg by mouth daily as needed.  Marland Kitchen oxyCODONE-acetaminophen (PERCOCET) 7.5-325 MG tablet Take 1 tablet by mouth every 4 (four) hours as needed.  . SYMBICORT 160-4.5 MCG/ACT inhaler Inhale 2 puffs into the lungs 2 (two)  times daily.  Marland Kitchen torsemide (DEMADEX) 20 MG tablet Take 20 mg by mouth 2 (two) times daily.  . Vitamin D, Ergocalciferol, (DRISDOL) 1.25 MG (50000 UT) CAPS capsule Take 50,000 Units by mouth every Wednesday.     Allergies:   Hydrocodone-acetaminophen and Tramadol   Social History   Socioeconomic History  . Marital status: Single    Spouse name: Not on file  . Number of children: Not on file  . Years of education: Not on file  . Highest education level: Not on file  Occupational History  . Not on file  Tobacco Use  . Smoking status: Current Every Day Smoker    Packs/day: 3.00    Types: Cigarettes  . Smokeless tobacco: Never Used  Vaping Use  . Vaping Use: Never used  Substance and Sexual Activity  . Alcohol use: Not Currently  . Drug use: Not on file  . Sexual activity: Not on file  Other Topics Concern  . Not on file  Social History  Narrative  . Not on file   Social Determinants of Health   Financial Resource Strain:   . Difficulty of Paying Living Expenses: Not on file  Food Insecurity:   . Worried About Charity fundraiser in the Last Year: Not on file  . Ran Out of Food in the Last Year: Not on file  Transportation Needs:   . Lack of Transportation (Medical): Not on file  . Lack of Transportation (Non-Medical): Not on file  Physical Activity:   . Days of Exercise per Week: Not on file  . Minutes of Exercise per Session: Not on file  Stress:   . Feeling of Stress : Not on file  Social Connections:   . Frequency of Communication with Friends and Family: Not on file  . Frequency of Social Gatherings with Friends and Family: Not on file  . Attends Religious Services: Not on file  . Active Member of Clubs or Organizations: Not on file  . Attends Archivist Meetings: Not on file  . Marital Status: Not on file     Family History: The patient's family history includes Cancer in his father and mother; Diabetes in his maternal grandmother and mother;  Hypertension in his mother and sister; Kidney cancer in his paternal aunt; Seizures in his sister; Stroke in his sister.  ROS:   Please see the history of present illness.    All other systems reviewed and are negative.  EKGs/Labs/Other Studies Reviewed:    The following studies were reviewed today: I reviewed records from [facility] hospital extensively.   Recent Labs: 09/13/2019: ALT 10; BUN 17; Creatinine 1.09; Hemoglobin 15.7; Platelet Count 229; Potassium 4.2; Sodium 133  Recent Lipid Panel No results found for: CHOL, TRIG, HDL, CHOLHDL, VLDL, LDLCALC, LDLDIRECT  Physical Exam:    VS:  BP 118/80 (BP Location: Right Arm, Patient Position: Sitting, Cuff Size: Large)   Pulse 80   Ht '5\' 7"'  (1.702 m)   Wt 264 lb (119.7 kg)   SpO2 95%   BMI 41.35 kg/m     Wt Readings from Last 3 Encounters:  04/27/20 264 lb (119.7 kg)  09/13/19 228 lb (103.4 kg)  09/01/19 225 lb (102.1 kg)     GEN: Patient is in no acute distress HEENT: Normal NECK: No JVD; No carotid bruits LYMPHATICS: No lymphadenopathy CARDIAC: Hear sounds regular, 2/6 systolic murmur at the apex. RESPIRATORY:  Clear to auscultation without rales, wheezing or rhonchi  ABDOMEN: Soft, non-tender, non-distended MUSCULOSKELETAL:  No edema; No deformity  SKIN: Warm and dry NEUROLOGIC:  Alert and oriented x 3 PSYCHIATRIC:  Normal affect   Signed, Jenean Lindau, MD  04/27/2020 2:43 PM    [facility]

## 2020-04-27 NOTE — Telephone Encounter (Signed)
Gayle with [facility] states this morning the patient called her to report his weight gain. She reports yesterday he weighed 255 lbs (see previous note) and today he weights 263 lbs. Edd Fabian is aware that patient has an appointment scheduled for today at 2:00 PM with Dr. Geraldo Pitter, however, she wanted to let Dr. Geraldo Pitter know that patient's medication may need to be modified.

## 2020-04-27 NOTE — Patient Instructions (Signed)
Pt sent to [facility] for a direct admission. Pt will have no FU with our office.

## 2020-04-27 NOTE — Telephone Encounter (Signed)
FYI pt has an appt today.

## 2020-04-29 DIAGNOSIS — J449 Chronic obstructive pulmonary disease, unspecified: Secondary | ICD-10-CM | POA: Diagnosis not present

## 2020-04-29 DIAGNOSIS — E8809 Other disorders of plasma-protein metabolism, not elsewhere classified: Secondary | ICD-10-CM

## 2020-04-29 DIAGNOSIS — I4819 Other persistent atrial fibrillation: Secondary | ICD-10-CM

## 2020-04-29 DIAGNOSIS — I5033 Acute on chronic diastolic (congestive) heart failure: Secondary | ICD-10-CM | POA: Diagnosis not present

## 2020-04-29 DIAGNOSIS — N183 Chronic kidney disease, stage 3 unspecified: Secondary | ICD-10-CM

## 2020-04-30 DIAGNOSIS — J449 Chronic obstructive pulmonary disease, unspecified: Secondary | ICD-10-CM | POA: Diagnosis not present

## 2020-04-30 DIAGNOSIS — I5033 Acute on chronic diastolic (congestive) heart failure: Secondary | ICD-10-CM | POA: Diagnosis not present

## 2020-04-30 DIAGNOSIS — N183 Chronic kidney disease, stage 3 unspecified: Secondary | ICD-10-CM | POA: Diagnosis not present

## 2020-04-30 DIAGNOSIS — E8809 Other disorders of plasma-protein metabolism, not elsewhere classified: Secondary | ICD-10-CM | POA: Diagnosis not present

## 2020-05-01 DIAGNOSIS — J449 Chronic obstructive pulmonary disease, unspecified: Secondary | ICD-10-CM | POA: Diagnosis not present

## 2020-05-01 DIAGNOSIS — N183 Chronic kidney disease, stage 3 unspecified: Secondary | ICD-10-CM | POA: Diagnosis not present

## 2020-05-01 DIAGNOSIS — I5033 Acute on chronic diastolic (congestive) heart failure: Secondary | ICD-10-CM | POA: Diagnosis not present

## 2020-05-01 DIAGNOSIS — E8809 Other disorders of plasma-protein metabolism, not elsewhere classified: Secondary | ICD-10-CM | POA: Diagnosis not present

## 2020-05-02 DIAGNOSIS — E8809 Other disorders of plasma-protein metabolism, not elsewhere classified: Secondary | ICD-10-CM | POA: Diagnosis not present

## 2020-05-02 DIAGNOSIS — J449 Chronic obstructive pulmonary disease, unspecified: Secondary | ICD-10-CM | POA: Diagnosis not present

## 2020-05-02 DIAGNOSIS — N183 Chronic kidney disease, stage 3 unspecified: Secondary | ICD-10-CM | POA: Diagnosis not present

## 2020-05-02 DIAGNOSIS — I5033 Acute on chronic diastolic (congestive) heart failure: Secondary | ICD-10-CM | POA: Diagnosis not present

## 2020-05-03 DIAGNOSIS — E8809 Other disorders of plasma-protein metabolism, not elsewhere classified: Secondary | ICD-10-CM | POA: Diagnosis not present

## 2020-05-03 DIAGNOSIS — I5033 Acute on chronic diastolic (congestive) heart failure: Secondary | ICD-10-CM | POA: Diagnosis not present

## 2020-05-03 DIAGNOSIS — N183 Chronic kidney disease, stage 3 unspecified: Secondary | ICD-10-CM | POA: Diagnosis not present

## 2020-05-03 DIAGNOSIS — J449 Chronic obstructive pulmonary disease, unspecified: Secondary | ICD-10-CM | POA: Diagnosis not present

## 2020-05-04 DIAGNOSIS — J449 Chronic obstructive pulmonary disease, unspecified: Secondary | ICD-10-CM | POA: Diagnosis not present

## 2020-05-04 DIAGNOSIS — E8809 Other disorders of plasma-protein metabolism, not elsewhere classified: Secondary | ICD-10-CM | POA: Diagnosis not present

## 2020-05-04 DIAGNOSIS — N183 Chronic kidney disease, stage 3 unspecified: Secondary | ICD-10-CM | POA: Diagnosis not present

## 2020-05-04 DIAGNOSIS — I5033 Acute on chronic diastolic (congestive) heart failure: Secondary | ICD-10-CM | POA: Diagnosis not present

## 2020-05-05 DIAGNOSIS — N183 Chronic kidney disease, stage 3 unspecified: Secondary | ICD-10-CM | POA: Diagnosis not present

## 2020-05-05 DIAGNOSIS — C252 Malignant neoplasm of tail of pancreas: Secondary | ICD-10-CM

## 2020-05-05 DIAGNOSIS — R180 Malignant ascites: Secondary | ICD-10-CM

## 2020-05-05 DIAGNOSIS — E8809 Other disorders of plasma-protein metabolism, not elsewhere classified: Secondary | ICD-10-CM | POA: Diagnosis not present

## 2020-05-05 DIAGNOSIS — I5033 Acute on chronic diastolic (congestive) heart failure: Secondary | ICD-10-CM | POA: Diagnosis not present

## 2020-05-05 DIAGNOSIS — J449 Chronic obstructive pulmonary disease, unspecified: Secondary | ICD-10-CM | POA: Diagnosis not present

## 2020-05-06 DIAGNOSIS — I4819 Other persistent atrial fibrillation: Secondary | ICD-10-CM

## 2020-05-06 DIAGNOSIS — N183 Chronic kidney disease, stage 3 unspecified: Secondary | ICD-10-CM | POA: Diagnosis not present

## 2020-05-06 DIAGNOSIS — J449 Chronic obstructive pulmonary disease, unspecified: Secondary | ICD-10-CM | POA: Diagnosis not present

## 2020-05-06 DIAGNOSIS — I272 Pulmonary hypertension, unspecified: Secondary | ICD-10-CM

## 2020-05-06 DIAGNOSIS — E1165 Type 2 diabetes mellitus with hyperglycemia: Secondary | ICD-10-CM | POA: Diagnosis not present

## 2020-05-06 DIAGNOSIS — I5033 Acute on chronic diastolic (congestive) heart failure: Secondary | ICD-10-CM

## 2020-05-07 DIAGNOSIS — I5033 Acute on chronic diastolic (congestive) heart failure: Secondary | ICD-10-CM | POA: Diagnosis not present

## 2020-05-07 DIAGNOSIS — N183 Chronic kidney disease, stage 3 unspecified: Secondary | ICD-10-CM | POA: Diagnosis not present

## 2020-05-07 DIAGNOSIS — J449 Chronic obstructive pulmonary disease, unspecified: Secondary | ICD-10-CM | POA: Diagnosis not present

## 2020-05-07 DIAGNOSIS — E1165 Type 2 diabetes mellitus with hyperglycemia: Secondary | ICD-10-CM | POA: Diagnosis not present

## 2020-05-08 DIAGNOSIS — I5033 Acute on chronic diastolic (congestive) heart failure: Secondary | ICD-10-CM | POA: Diagnosis not present

## 2020-05-08 DIAGNOSIS — N183 Chronic kidney disease, stage 3 unspecified: Secondary | ICD-10-CM | POA: Diagnosis not present

## 2020-05-08 DIAGNOSIS — J449 Chronic obstructive pulmonary disease, unspecified: Secondary | ICD-10-CM | POA: Diagnosis not present

## 2020-05-08 DIAGNOSIS — E1165 Type 2 diabetes mellitus with hyperglycemia: Secondary | ICD-10-CM | POA: Diagnosis not present

## 2020-05-26 DEATH — deceased

## 2021-09-16 IMAGING — CT NM PET TUM IMG INITIAL (PI) SKULL BASE T - THIGH
1 of 8 series · 1 of 25 positions shown · non-contrast
Comparison: None.

CLINICAL DATA: Subsequent treatment strategy for pancreatic
adenocarcinoma.

EXAM:
NUCLEAR MEDICINE PET SKULL BASE TO THIGH
TECHNIQUE: 11.4 mCi F-18 FDG was injected intravenously. Full-ring PET imaging
was performed from the skull base to thigh after the radiotracer. CT
data was obtained and used for attenuation correction and anatomic
localization.
Fasting blood glucose: 329 mg/dl

[Series 4: ct sk_thigh 5.0 hd_fov · axial · 5.0mm · 1.07mm/px · 1 of 238 slices shown]
[im 238/238  brain]
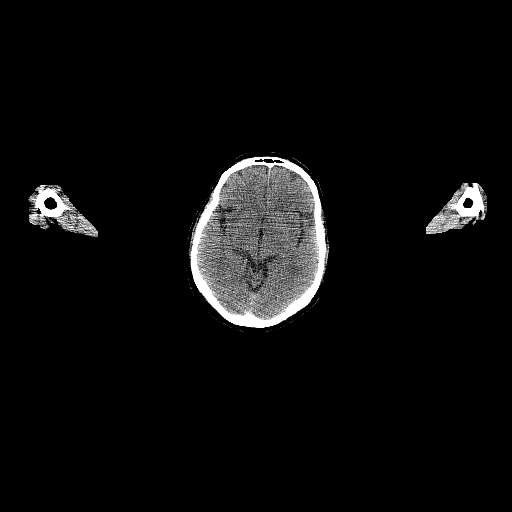

[1 of 25 positions shown; findings below may reference images not displayed]

FINDINGS: Mediastinal blood pool activity: SUV max

Liver activity: SUV max

NECK: No hypermetabolic lymph nodes in the neck.

Incidental CT findings: None

CHEST: No hypermetabolic mediastinal or hilar nodes. No suspicious
pulmonary nodules on the CT scan.

Incidental CT findings: none

ABDOMEN/PELVIS: Approximately 4.5 x 3.1 cm mass in the mid body of
the pancreas has moderate metabolic activity (SUV max equal 4.7)
which is greater than adjacent normal pancreatic tissue and similar
to background liver activity.

No distinct hypermetabolic peripancreatic or periportal lymph nodes.

No focal metabolic activity identified within the liver.

There is increased haziness within the a ventral peritoneal
space/omentum (image 145/4). There is mild metabolic activity
associated with this hazy peritoneal stranding with SUV max equal
3.2.

Incidental CT findings: Small amount free fluid surrounding the
spleen. Moderate free fluid in the pelvis. postcholecystectomy.

SKELETON: No focal hypermetabolic activity to suggest skeletal
metastasis.

Incidental CT findings: none
IMPRESSION: 1. Moderate metabolic activity associated pancreatic mass consistent
with pancreatic adenocarcinoma.
2. No evidence of metastatic lymphadenopathy.
3. No evidence of solid organ metastasis .
4. New intraperitoneal free fluid and increased haziness within the
ventral peritoneum/omentum. Findings suggest volume overload.
Metastatic peritoneal disease is less favored.
